# Patient Record
Sex: Female | Born: 2000 | Race: White | Hispanic: No | Marital: Single
Health system: Southern US, Community
[De-identification: ages and names within clinical notes are randomized; demographics above are authoritative.]

---
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
## 2018-04-08 DIAGNOSIS — Z23 Encounter for immunization: Secondary | ICD-10-CM | POA: Diagnosis not present

## 2018-05-06 DIAGNOSIS — R1084 Generalized abdominal pain: Secondary | ICD-10-CM | POA: Diagnosis not present

## 2018-05-08 DIAGNOSIS — R1084 Generalized abdominal pain: Secondary | ICD-10-CM | POA: Diagnosis not present

## 2018-05-13 DIAGNOSIS — Z23 Encounter for immunization: Secondary | ICD-10-CM | POA: Diagnosis not present

## 2018-06-22 DIAGNOSIS — J060 Acute laryngopharyngitis: Secondary | ICD-10-CM | POA: Diagnosis not present

## 2018-08-10 DIAGNOSIS — J020 Streptococcal pharyngitis: Secondary | ICD-10-CM | POA: Diagnosis not present

## 2018-08-10 DIAGNOSIS — J029 Acute pharyngitis, unspecified: Secondary | ICD-10-CM | POA: Diagnosis not present

## 2018-09-15 DIAGNOSIS — L700 Acne vulgaris: Secondary | ICD-10-CM | POA: Diagnosis not present

## 2018-09-25 DIAGNOSIS — L040 Acute lymphadenitis of face, head and neck: Secondary | ICD-10-CM | POA: Diagnosis not present

## 2018-10-01 DIAGNOSIS — B2790 Infectious mononucleosis, unspecified without complication: Secondary | ICD-10-CM | POA: Diagnosis not present

## 2018-10-01 DIAGNOSIS — B349 Viral infection, unspecified: Secondary | ICD-10-CM | POA: Diagnosis not present

## 2018-10-02 DIAGNOSIS — L040 Acute lymphadenitis of face, head and neck: Secondary | ICD-10-CM | POA: Diagnosis not present

## 2018-11-16 DIAGNOSIS — J029 Acute pharyngitis, unspecified: Secondary | ICD-10-CM | POA: Diagnosis not present

## 2018-11-16 DIAGNOSIS — B349 Viral infection, unspecified: Secondary | ICD-10-CM | POA: Diagnosis not present

## 2018-11-20 DIAGNOSIS — J019 Acute sinusitis, unspecified: Secondary | ICD-10-CM | POA: Diagnosis not present

## 2018-12-11 DIAGNOSIS — J019 Acute sinusitis, unspecified: Secondary | ICD-10-CM | POA: Diagnosis not present
